# Patient Record
Sex: Male | Born: 1945 | Race: White | Hispanic: No | Smoking: Never smoker
Health system: Southern US, Community
[De-identification: ages and names within clinical notes are randomized; demographics above are authoritative.]

## PROBLEM LIST (undated history)

## (undated) DIAGNOSIS — R079 Chest pain, unspecified: Secondary | ICD-10-CM

## (undated) DIAGNOSIS — K5900 Constipation, unspecified: Secondary | ICD-10-CM

## (undated) DIAGNOSIS — K219 Gastro-esophageal reflux disease without esophagitis: Secondary | ICD-10-CM

## (undated) DIAGNOSIS — D649 Anemia, unspecified: Secondary | ICD-10-CM

## (undated) DIAGNOSIS — G809 Cerebral palsy, unspecified: Secondary | ICD-10-CM

## (undated) DIAGNOSIS — R0602 Shortness of breath: Secondary | ICD-10-CM

---

## 2004-08-03 ENCOUNTER — Ambulatory Visit: Payer: Self-pay | Admitting: Family Medicine

## 2008-03-27 ENCOUNTER — Ambulatory Visit: Payer: Self-pay | Admitting: Family Medicine

## 2009-03-21 ENCOUNTER — Ambulatory Visit: Payer: Self-pay | Admitting: Family Medicine

## 2010-03-03 ENCOUNTER — Ambulatory Visit: Payer: Self-pay | Admitting: Family Medicine

## 2011-11-12 ENCOUNTER — Ambulatory Visit: Payer: Self-pay | Admitting: Family Medicine

## 2012-09-25 ENCOUNTER — Other Ambulatory Visit: Payer: Self-pay | Admitting: Family Medicine

## 2012-09-26 LAB — URINALYSIS, COMPLETE
Bacteria: NONE SEEN
Bilirubin,UR: NEGATIVE
Blood: NEGATIVE
Glucose,UR: NEGATIVE mg/dL (ref 0–75)
Ketone: NEGATIVE
Leukocyte Esterase: NEGATIVE
Ph: 6 (ref 4.5–8.0)
Protein: NEGATIVE
RBC,UR: 1 /HPF (ref 0–5)
Squamous Epithelial: <1
WBC UR: <1 /HPF (ref 0–5)

## 2012-09-27 LAB — URINE CULTURE

## 2013-02-03 ENCOUNTER — Other Ambulatory Visit: Payer: Self-pay | Admitting: Family Medicine

## 2013-02-03 LAB — CBC WITH DIFFERENTIAL/PLATELET
Basophil #: 0.1 10*3/uL (ref 0.0–0.1)
Basophil %: 1 %
Lymphocyte #: 2 10*3/uL (ref 1.0–3.6)
Lymphocyte %: 29 %
MCHC: 34.1 g/dL (ref 32.0–36.0)
Monocyte %: 7.1 %
Neutrophil %: 54.3 %
Platelet: 269 10*3/uL (ref 150–440)
WBC: 7 10*3/uL (ref 3.8–10.6)

## 2013-02-03 LAB — COMPREHENSIVE METABOLIC PANEL
Albumin: 3.7 g/dL (ref 3.4–5.0)
Anion Gap: 9 (ref 7–16)
Calcium, Total: 9 mg/dL (ref 8.5–10.1)
Chloride: 105 mmol/L (ref 98–107)
Co2: 25 mmol/L (ref 21–32)
EGFR (African American): >60
EGFR (Non-African Amer.): >60
Glucose: 127 mg/dL — ABNORMAL HIGH (ref 65–99)
Osmolality: 279 (ref 275–301)
Potassium: 4.3 mmol/L (ref 3.5–5.1)
Total Protein: 6.7 g/dL (ref 6.4–8.2)

## 2013-04-27 ENCOUNTER — Other Ambulatory Visit: Payer: Self-pay

## 2013-04-27 LAB — URINALYSIS, COMPLETE
BACTERIA: NONE SEEN
Bilirubin,UR: NEGATIVE
Blood: NEGATIVE
Glucose,UR: NEGATIVE mg/dL (ref 0–75)
Hyaline Cast: 3
Ketone: NEGATIVE
LEUKOCYTE ESTERASE: NEGATIVE
NITRITE: NEGATIVE
Ph: 5 (ref 4.5–8.0)
Protein: NEGATIVE
SPECIFIC GRAVITY: 1.024 (ref 1.003–1.030)
Squamous Epithelial: NONE SEEN
WBC UR: <1 /HPF (ref 0–5)

## 2013-04-27 LAB — COMPREHENSIVE METABOLIC PANEL
ALBUMIN: 4.1 g/dL (ref 3.4–5.0)
ANION GAP: 3 — AB (ref 7–16)
Alkaline Phosphatase: 71 U/L
BILIRUBIN TOTAL: 0.4 mg/dL (ref 0.2–1.0)
BUN: 20 mg/dL — AB (ref 7–18)
CREATININE: 0.99 mg/dL (ref 0.60–1.30)
Calcium, Total: 9.1 mg/dL (ref 8.5–10.1)
Chloride: 105 mmol/L (ref 98–107)
Co2: 29 mmol/L (ref 21–32)
EGFR (Non-African Amer.): >60
GLUCOSE: 73 mg/dL (ref 65–99)
Osmolality: 275 (ref 275–301)
POTASSIUM: 4.6 mmol/L (ref 3.5–5.1)
SGOT(AST): 23 U/L (ref 15–37)
SGPT (ALT): 25 U/L (ref 12–78)
SODIUM: 137 mmol/L (ref 136–145)
Total Protein: 7.3 g/dL (ref 6.4–8.2)

## 2013-04-27 LAB — CBC WITH DIFFERENTIAL/PLATELET
Basophil #: 0.1 10*3/uL (ref 0.0–0.1)
Basophil %: 1 %
EOS ABS: 0.4 10*3/uL (ref 0.0–0.7)
Eosinophil %: 5.7 %
HCT: 41.4 % (ref 40.0–52.0)
HGB: 13.3 g/dL (ref 13.0–18.0)
Lymphocyte #: 2.2 10*3/uL (ref 1.0–3.6)
Lymphocyte %: 33.6 %
MCH: 30.3 pg (ref 26.0–34.0)
MCHC: 32.3 g/dL (ref 32.0–36.0)
MCV: 94 fL (ref 80–100)
MONO ABS: 0.7 x10 3/mm (ref 0.2–1.0)
Monocyte %: 9.9 %
Neutrophil #: 3.3 10*3/uL (ref 1.4–6.5)
Neutrophil %: 49.8 %
PLATELETS: 227 10*3/uL (ref 150–440)
RBC: 4.41 10*6/uL (ref 4.40–5.90)
RDW: 13.6 % (ref 11.5–14.5)
WBC: 6.6 10*3/uL (ref 3.8–10.6)

## 2013-04-27 LAB — TSH: Thyroid Stimulating Horm: 0.755 u[IU]/mL

## 2013-04-29 LAB — PSA: PSA: 0.3 ng/mL (ref 0.0–4.0)

## 2013-04-29 LAB — URINE CULTURE

## 2013-05-21 ENCOUNTER — Other Ambulatory Visit: Payer: Self-pay | Admitting: Family Medicine

## 2013-05-21 LAB — COMPREHENSIVE METABOLIC PANEL
ALK PHOS: 71 U/L
ALT: 26 U/L (ref 12–78)
Albumin: 4.1 g/dL (ref 3.4–5.0)
Anion Gap: 4 — ABNORMAL LOW (ref 7–16)
BILIRUBIN TOTAL: 0.4 mg/dL (ref 0.2–1.0)
BUN: 15 mg/dL (ref 7–18)
CALCIUM: 8.6 mg/dL (ref 8.5–10.1)
CHLORIDE: 107 mmol/L (ref 98–107)
CO2: 26 mmol/L (ref 21–32)
Creatinine: 0.99 mg/dL (ref 0.60–1.30)
EGFR (African American): >60
EGFR (Non-African Amer.): >60
Glucose: 147 mg/dL — ABNORMAL HIGH (ref 65–99)
OSMOLALITY: 277 (ref 275–301)
Potassium: 4 mmol/L (ref 3.5–5.1)
SGOT(AST): 25 U/L (ref 15–37)
SODIUM: 137 mmol/L (ref 136–145)
Total Protein: 7.2 g/dL (ref 6.4–8.2)

## 2013-05-21 LAB — CBC WITH DIFFERENTIAL/PLATELET
Basophil #: 0.1 10*3/uL (ref 0.0–0.1)
Basophil %: 0.8 %
EOS ABS: 0.3 10*3/uL (ref 0.0–0.7)
Eosinophil %: 2.7 %
HCT: 39.1 % — ABNORMAL LOW (ref 40.0–52.0)
HGB: 13.2 g/dL (ref 13.0–18.0)
LYMPHS ABS: 1.5 10*3/uL (ref 1.0–3.6)
Lymphocyte %: 13.4 %
MCH: 31.2 pg (ref 26.0–34.0)
MCHC: 33.9 g/dL (ref 32.0–36.0)
MCV: 92 fL (ref 80–100)
Monocyte #: 0.4 x10 3/mm (ref 0.2–1.0)
Monocyte %: 3.8 %
NEUTROS PCT: 79.3 %
Neutrophil #: 8.9 10*3/uL — ABNORMAL HIGH (ref 1.4–6.5)
Platelet: 217 10*3/uL (ref 150–440)
RBC: 4.25 10*6/uL — ABNORMAL LOW (ref 4.40–5.90)
RDW: 13.4 % (ref 11.5–14.5)
WBC: 11.3 10*3/uL — AB (ref 3.8–10.6)

## 2013-05-21 LAB — URINALYSIS, COMPLETE
BACTERIA: NONE SEEN
BLOOD: NEGATIVE
Bilirubin,UR: NEGATIVE
GLUCOSE, UR: NEGATIVE mg/dL (ref 0–75)
Ketone: NEGATIVE
Leukocyte Esterase: NEGATIVE
Nitrite: NEGATIVE
Ph: 5 (ref 4.5–8.0)
Protein: NEGATIVE
SPECIFIC GRAVITY: 1.015 (ref 1.003–1.030)
Squamous Epithelial: <1

## 2013-05-21 LAB — PRO B NATRIURETIC PEPTIDE: B-Type Natriuretic Peptide: 200 pg/mL — ABNORMAL HIGH (ref 0–125)

## 2013-05-22 LAB — URINE CULTURE

## 2014-09-27 ENCOUNTER — Inpatient Hospital Stay: Admit: 2014-09-27 | Payer: Self-pay

## 2014-09-27 ENCOUNTER — Other Ambulatory Visit
Admission: RE | Admit: 2014-09-27 | Discharge: 2014-09-27 | Disposition: A | Discharge status: Home / Self Care | Payer: Medicare Other | Source: Other Acute Inpatient Hospital | Attending: Family Medicine | Admitting: Family Medicine

## 2014-09-27 DIAGNOSIS — D649 Anemia, unspecified: Secondary | ICD-10-CM | POA: Diagnosis present

## 2014-09-27 DIAGNOSIS — I509 Heart failure, unspecified: Secondary | ICD-10-CM | POA: Insufficient documentation

## 2014-09-27 LAB — CBC WITH DIFFERENTIAL/PLATELET
BASOS ABS: 0.1 10*3/uL (ref 0–0.1)
Basophils Relative: 1 %
EOS ABS: 0.3 10*3/uL (ref 0–0.7)
Eosinophils Relative: 5 %
HEMATOCRIT: 22.8 % — AB (ref 40.0–52.0)
HEMOGLOBIN: 7 g/dL — AB (ref 13.0–18.0)
Lymphocytes Relative: 18 %
Lymphs Abs: 1.1 10*3/uL (ref 1.0–3.6)
MCH: 23.9 pg — ABNORMAL LOW (ref 26.0–34.0)
MCHC: 30.8 g/dL — AB (ref 32.0–36.0)
MCV: 77.3 fL — ABNORMAL LOW (ref 80.0–100.0)
Monocytes Absolute: 0.6 10*3/uL (ref 0.2–1.0)
Monocytes Relative: 10 %
NEUTROS ABS: 4 10*3/uL (ref 1.4–6.5)
Neutrophils Relative %: 66 %
Platelets: 358 10*3/uL (ref 150–440)
RBC: 2.95 MIL/uL — AB (ref 4.40–5.90)
RDW: 15.8 % — ABNORMAL HIGH (ref 11.5–14.5)
WBC: 6.1 10*3/uL (ref 3.8–10.6)

## 2014-09-27 LAB — COMPREHENSIVE METABOLIC PANEL
ALBUMIN: 3.2 g/dL — AB (ref 3.5–5.0)
ALT: 19 U/L (ref 17–63)
AST: 19 U/L (ref 15–41)
Alkaline Phosphatase: 60 U/L (ref 38–126)
Anion gap: 8 (ref 5–15)
BUN: 14 mg/dL (ref 6–20)
CO2: 24 mmol/L (ref 22–32)
Calcium: 8.6 mg/dL — ABNORMAL LOW (ref 8.9–10.3)
Chloride: 105 mmol/L (ref 101–111)
Creatinine, Ser: 0.9 mg/dL (ref 0.61–1.24)
GFR calc Af Amer: >60 mL/min (ref >60)
GFR calc non Af Amer: >60 mL/min (ref >60)
GLUCOSE: 175 mg/dL — AB (ref 65–99)
Potassium: 4.5 mmol/L (ref 3.5–5.1)
Sodium: 137 mmol/L (ref 135–145)
TOTAL PROTEIN: 6.1 g/dL — AB (ref 6.5–8.1)
Total Bilirubin: 0.4 mg/dL (ref 0.3–1.2)

## 2014-11-16 ENCOUNTER — Encounter: Payer: Self-pay | Admitting: *Deleted

## 2014-11-17 ENCOUNTER — Encounter
Admission: RE | Disposition: A | Discharge status: Home Health | Payer: Self-pay | Source: Ambulatory Visit | Attending: Gastroenterology

## 2014-11-17 ENCOUNTER — Ambulatory Visit
Admission: RE | Admit: 2014-11-17 | Discharge: 2014-11-17 | Disposition: A | Discharge status: Home Health | Payer: Medicare Other | Source: Ambulatory Visit | Attending: Gastroenterology | Admitting: Gastroenterology

## 2014-11-17 ENCOUNTER — Encounter: Payer: Self-pay | Admitting: Anesthesiology

## 2014-11-17 DIAGNOSIS — Z539 Procedure and treatment not carried out, unspecified reason: Secondary | ICD-10-CM | POA: Insufficient documentation

## 2014-11-17 HISTORY — DX: Chest pain, unspecified: R07.9

## 2014-11-17 HISTORY — DX: Cerebral palsy, unspecified: G80.9

## 2014-11-17 HISTORY — DX: Constipation, unspecified: K59.00

## 2014-11-17 HISTORY — DX: Gastro-esophageal reflux disease without esophagitis: K21.9

## 2014-11-17 HISTORY — DX: Anemia, unspecified: D64.9

## 2014-11-17 HISTORY — PX: ESOPHAGOGASTRODUODENOSCOPY (EGD) WITH PROPOFOL: SHX5813

## 2014-11-17 HISTORY — DX: Shortness of breath: R06.02

## 2014-11-17 HISTORY — PX: COLONOSCOPY WITH PROPOFOL: SHX5780

## 2014-11-17 SURGERY — COLONOSCOPY WITH PROPOFOL
Anesthesia: General

## 2014-11-18 ENCOUNTER — Encounter: Payer: Self-pay | Admitting: Gastroenterology

## 2017-02-15 ENCOUNTER — Encounter: Payer: Self-pay | Admitting: Emergency Medicine

## 2017-02-15 ENCOUNTER — Emergency Department: Payer: Medicare Other

## 2017-02-15 ENCOUNTER — Emergency Department
Admission: EM | Admit: 2017-02-15 | Discharge: 2017-02-15 | Disposition: A | Discharge status: Home / Self Care | Payer: Medicare Other | Attending: Emergency Medicine | Admitting: Emergency Medicine

## 2017-02-15 DIAGNOSIS — Y999 Unspecified external cause status: Secondary | ICD-10-CM | POA: Diagnosis not present

## 2017-02-15 DIAGNOSIS — W19XXXA Unspecified fall, initial encounter: Secondary | ICD-10-CM | POA: Diagnosis not present

## 2017-02-15 DIAGNOSIS — Z79899 Other long term (current) drug therapy: Secondary | ICD-10-CM | POA: Insufficient documentation

## 2017-02-15 DIAGNOSIS — Y92129 Unspecified place in nursing home as the place of occurrence of the external cause: Secondary | ICD-10-CM | POA: Insufficient documentation

## 2017-02-15 DIAGNOSIS — Z23 Encounter for immunization: Secondary | ICD-10-CM | POA: Insufficient documentation

## 2017-02-15 DIAGNOSIS — Y939 Activity, unspecified: Secondary | ICD-10-CM | POA: Diagnosis not present

## 2017-02-15 DIAGNOSIS — G809 Cerebral palsy, unspecified: Secondary | ICD-10-CM | POA: Diagnosis not present

## 2017-02-15 DIAGNOSIS — S0990XA Unspecified injury of head, initial encounter: Secondary | ICD-10-CM | POA: Diagnosis present

## 2017-02-15 DIAGNOSIS — S0101XA Laceration without foreign body of scalp, initial encounter: Secondary | ICD-10-CM | POA: Diagnosis not present

## 2017-02-15 LAB — COMPREHENSIVE METABOLIC PANEL
ALBUMIN: 4.2 g/dL (ref 3.5–5.0)
ALK PHOS: 63 U/L (ref 38–126)
ALT: 19 U/L (ref 17–63)
ANION GAP: 8 (ref 5–15)
AST: 25 U/L (ref 15–41)
BUN: 26 mg/dL — ABNORMAL HIGH (ref 6–20)
CALCIUM: 9 mg/dL (ref 8.9–10.3)
CO2: 23 mmol/L (ref 22–32)
Chloride: 107 mmol/L (ref 101–111)
Creatinine, Ser: 1.08 mg/dL (ref 0.61–1.24)
GFR calc Af Amer: >60 mL/min (ref >60)
GFR calc non Af Amer: >60 mL/min (ref >60)
GLUCOSE: 133 mg/dL — AB (ref 65–99)
POTASSIUM: 4.3 mmol/L (ref 3.5–5.1)
SODIUM: 138 mmol/L (ref 135–145)
Total Bilirubin: 0.6 mg/dL (ref 0.3–1.2)
Total Protein: 7.1 g/dL (ref 6.5–8.1)

## 2017-02-15 LAB — CBC WITH DIFFERENTIAL/PLATELET
Basophils Absolute: 0.1 10*3/uL (ref 0–0.1)
Basophils Relative: 1 %
Eosinophils Absolute: 0.2 10*3/uL (ref 0–0.7)
Eosinophils Relative: 4 %
HEMATOCRIT: 40.5 % (ref 40.0–52.0)
Hemoglobin: 13.6 g/dL (ref 13.0–18.0)
LYMPHS PCT: 30 %
Lymphs Abs: 1.6 10*3/uL (ref 1.0–3.6)
MCH: 30.7 pg (ref 26.0–34.0)
MCHC: 33.5 g/dL (ref 32.0–36.0)
MCV: 91.6 fL (ref 80.0–100.0)
MONO ABS: 0.4 10*3/uL (ref 0.2–1.0)
MONOS PCT: 7 %
NEUTROS ABS: 3.2 10*3/uL (ref 1.4–6.5)
Neutrophils Relative %: 58 %
Platelets: 254 10*3/uL (ref 150–440)
RBC: 4.42 MIL/uL (ref 4.40–5.90)
RDW: 13 % (ref 11.5–14.5)
WBC: 5.4 10*3/uL (ref 3.8–10.6)

## 2017-02-15 MED ORDER — TETANUS-DIPHTH-ACELL PERTUSSIS 5-2.5-18.5 LF-MCG/0.5 IM SUSP
0.5000 mL | Freq: Once | INTRAMUSCULAR | Status: AC
  Administered 2017-02-15: 0.5 mL via INTRAMUSCULAR
  Filled 2017-02-15: qty 0.5

## 2017-02-15 MED ORDER — LIDOCAINE-EPINEPHRINE 1 %-1:100000 IJ SOLN
10.0000 mL | Freq: Once | INTRAMUSCULAR | Status: AC
  Administered 2017-02-15: 10 mL via INTRADERMAL
  Filled 2017-02-15 (×2): qty 10

## 2017-02-15 NOTE — Discharge Instructions (Signed)
Today I placed 4 staples in your scalp.  It is important that they come out in 10-14 days.  Please do not get your head wet for at least 24 hours and then afterwards make sure you keep your wound clean and dry.  Return to the emergency department for any concerns.  It was a pleasure to take care of you today, and thank you for coming to our emergency department.  If you have any questions or concerns before leaving please ask the nurse to grab me and I'm more than happy to go through your aftercare instructions again.  If you were prescribed any opioid pain medication today such as Norco, Vicodin, Percocet, morphine, hydrocodone, or oxycodone please make sure you do not drive when you are taking this medication as it can alter your ability to drive safely.  If you have any concerns once you are home that you are not improving or are in fact getting worse before you can make it to your follow-up appointment, please do not hesitate to call [phone] and come back for further evaluation.  Merrily BrittleNeil Makari Sanko, MD  Results for orders placed or performed during the hospital encounter of 02/15/17  Comprehensive metabolic panel  Result Value Ref Range   Sodium 138 135 - 145 mmol/L   Potassium 4.3 3.5 - 5.1 mmol/L   Chloride 107 101 - 111 mmol/L   CO2 23 22 - 32 mmol/L   Glucose, Bld 133 (H) 65 - 99 mg/dL   BUN 26 (H) 6 - 20 mg/dL   Creatinine, Ser 4.091.08 0.61 - 1.24 mg/dL   Calcium 9.0 8.9 - 81.110.3 mg/dL   Total Protein 7.1 6.5 - 8.1 g/dL   Albumin 4.2 3.5 - 5.0 g/dL   AST 25 15 - 41 U/L   ALT 19 17 - 63 U/L   Alkaline Phosphatase 63 38 - 126 U/L   Total Bilirubin 0.6 0.3 - 1.2 mg/dL   GFR calc non Af Amer >60 >60 mL/min   GFR calc Af Amer >60 >60 mL/min   Anion gap 8 5 - 15  CBC with Differential  Result Value Ref Range   WBC 5.4 3.8 - 10.6 K/uL   RBC 4.42 4.40 - 5.90 MIL/uL   Hemoglobin 13.6 13.0 - 18.0 g/dL   HCT 91.440.5 78.240.0 - 95.652.0 %   MCV 91.6 80.0 - 100.0 fL   MCH 30.7 26.0 - 34.0 pg   MCHC 33.5  32.0 - 36.0 g/dL   RDW 21.313.0 08.611.5 - 57.814.5 %   Platelets 254 150 - 440 K/uL   Neutrophils Relative % 58 %   Neutro Abs 3.2 1.4 - 6.5 K/uL   Lymphocytes Relative 30 %   Lymphs Abs 1.6 1.0 - 3.6 K/uL   Monocytes Relative 7 %   Monocytes Absolute 0.4 0.2 - 1.0 K/uL   Eosinophils Relative 4 %   Eosinophils Absolute 0.2 0 - 0.7 K/uL   Basophils Relative 1 %   Basophils Absolute 0.1 0 - 0.1 K/uL   Ct Head Wo Contrast  Result Date: 02/15/2017 CLINICAL DATA:  Unwitnessed fall, laceration to back of head, history of cerebral palsy EXAM: CT HEAD WITHOUT CONTRAST TECHNIQUE: Contiguous axial images were obtained from the base of the skull through the vertex without intravenous contrast. Sagittal and coronal MPR images reconstructed from axial data set. COMPARISON:  None FINDINGS: Brain: Generalized atrophy. Normal ventricular morphology. No midline shift or mass effect. Otherwise normal appearance of brain parenchyma. No intracranial hemorrhage, mass lesion,  or evidence of acute infarction. No extra-axial fluid collections. Vascular: Minimal atherosclerotic calcifications within the internal carotid arteries at the skullbase. No hyperdense vessels. Skull: Intact Sinuses/Orbits: Clear Other: Small posterior RIGHT parietal scalp laceration and mild soft tissue swelling IMPRESSION: Generalized atrophy. No acute intracranial abnormalities. Electronically Signed   By: Ulyses SouthwardMark  Boles M.D.   On: 02/15/2017 11:54   Dg Chest Port 1 View  Result Date: 02/15/2017 CLINICAL DATA:  Patient presents to ED via [facility] from [facility] post unwitnessed fall. Patient has laceration noted to back of head. Patient c/o left side pain. Hx - Cerebral palsy, non-smoker. EXAM: PORTABLE CHEST - 1 VIEW COMPARISON:  none FINDINGS: Low lung volumes with resultant crowding of bronchovascular spur structures. No confluent airspace disease or overt edema. Heart size upper limits normal for technique. Retrocardiac double density suggesting  hiatal hernia. No effusion.  No pneumothorax. Chronic appearing fracture deformity of right fifth rib laterally. Visualized bones otherwise unremarkable. IMPRESSION: Low volumes.  No acute cardiopulmonary disease. Electronically Signed   By: Corlis Leak  Hassell M.D.   On: 02/15/2017 11:04

## 2017-02-15 NOTE — ED Notes (Signed)
Pharmacy called and notified of need of lido with epi.

## 2017-02-15 NOTE — ED Provider Notes (Signed)
[facility] Emergency Department Provider Note  ____________________________________________   First MD Initiated Contact with Patient 02/15/17 1043     (approximate)  I have reviewed the triage vital signs and the nursing notes.   HISTORY  Chief Complaint Fall  Level 5 exemption history limited by the patient's developmental delay  HPI Jamie Carrillo is a [age] y.o. male is brought to the emergency department by EMS after he had a fall at his nursing home.  According to EMS the fall was unwitnessed.  The patient has a long-standing history of cerebral palsy and normally transfers on his own and staff says he frequently falls when transferring.  His last tetanus is unknown.  He does have a laceration to his right occiput.  He takes no blood thinning medication.  He currently has no complaints.  Past Medical History:  Diagnosis Date  . Anemia   . Cerebral palsy (HCC)   . Chest pain   . Constipation   . GERD (gastroesophageal reflux disease)   . SOB (shortness of breath)     There are no active problems to display for this patient.   Past Surgical History:  Procedure Laterality Date  . COLONOSCOPY WITH PROPOFOL N/A 11/17/2014   Procedure: COLONOSCOPY WITH PROPOFOL;  Surgeon: Elnita MaxwellMatthew Gordon Rein, MD;  [facility];  Service: Endoscopy;  Laterality: N/A;  . ESOPHAGOGASTRODUODENOSCOPY (EGD) WITH PROPOFOL N/A 11/17/2014   Procedure: ESOPHAGOGASTRODUODENOSCOPY (EGD) WITH PROPOFOL;  Surgeon: Elnita MaxwellMatthew Gordon Rein, MD;  [facility];  Service: Endoscopy;  Laterality: N/A;    Prior to Admission medications   Medication Sig Start Date End Date Taking? Authorizing Provider  acetaminophen (TYLENOL) 500 MG tablet Take 500 mg by mouth every 6 (six) hours as needed.    [provider]  amoxicillin (AMOXIL) 500 MG tablet Take 500 mg by mouth 2 (two) times daily.    [provider]  Ascorbic Acid (VITAMIN C) 1000 MG tablet Take 1,000  mg by mouth daily.    [provider]  cetirizine (ZYRTEC) 10 MG tablet Take 10 mg by mouth daily.    [provider]  citalopram (CELEXA) 20 MG tablet Take 20 mg by mouth daily.    [provider]  clarithromycin (BIAXIN) 500 MG tablet Take 500 mg by mouth 2 (two) times daily.    [provider]  diclofenac sodium (VOLTAREN) 1 % GEL Apply 2 g topically 2 (two) times daily.    [provider]  doxazosin (CARDURA) 4 MG tablet Take 4 mg by mouth daily.    [provider]  Ferrous Sulfate Dried 200 (65 FE) MG TABS Take 325 mg by mouth every morning.    [provider]  furosemide (LASIX) 20 MG tablet Take 20 mg by mouth daily.    [provider]  HYDROcodone-acetaminophen (NORCO) 7.5-325 MG per tablet Take 1 tablet by mouth every 6 (six) hours as needed for moderate pain.    [provider]  ipratropium-albuterol (DUONEB) 0.5-2.5 (3) MG/3ML SOLN Take 3 mLs by nebulization 4 (four) times daily.    [provider]  isotretinoin (ACCUTANE) 10 MG capsule Take 10 mg by mouth 2 (two) times daily.    [provider]  lisinopril (PRINIVIL,ZESTRIL) 5 MG tablet Take 5 mg by mouth daily.    [provider]  loratadine (CLARITIN) 10 MG tablet Take 10 mg by mouth daily.    [provider]  magnesium hydroxide (MILK OF MAGNESIA) 400 MG/5ML suspension  Take by mouth daily as needed for mild constipation.    [provider]  magnesium oxide (MAG-OX) 400 MG tablet Take 400 mg by mouth daily.    [provider]  Melatonin 3 MG TABS Take 3 mg by mouth 1 day or 1 dose.    [provider]  Multiple Vitamins-Minerals (MULTIVITAMIN WITH MINERALS) tablet Take 1 tablet by mouth daily.    [provider]  omeprazole (PRILOSEC) 20 MG capsule Take 20 mg by mouth daily.    [provider]  pantoprazole (PROTONIX) 40 MG tablet Take 40 mg by mouth daily.    [provider]  Propylene Glycol 0.6 % SOLN Apply 1 drop to eye 4 (four) times daily as needed.    [provider]  senna-docusate (SENOKOT-S) 8.6-50 MG per tablet Take 1 tablet by mouth at bedtime as needed for mild constipation.    [provider]  simvastatin (ZOCOR) 40 MG tablet Take 40 mg by mouth daily.    [provider]  tiZANidine (ZANAFLEX) 4 MG capsule Take 4 mg by mouth every morning.    [provider]  traMADol (ULTRAM) 50 MG tablet Take by mouth every 6 (six) hours as needed.    [provider]  vitamin B-12 (CYANOCOBALAMIN) 1000 MCG tablet Take 1,000 mcg by mouth daily.    [provider]    Allergies Patient has no known allergies.  No family history on file.  Social History Social History   Tobacco Use  . Smoking status: Never Smoker  . Smokeless tobacco: Never Used  Substance Use Topics  . Alcohol use: No    Frequency: Never  . Drug use: No    Review of Systems Level 5 exemption history limited by the patient's developmental delay  ____________________________________________   PHYSICAL EXAM:  VITAL SIGNS: ED Triage Vitals [02/15/17 1040]  Enc Vitals Group     BP      Pulse      Resp      Temp      Temp src      SpO2      Weight 150 lb (68 kg)     Height 5\' 6"  (1.676 m)     Head Circumference      [facility] Flow      Pain Score      Pain Loc      Pain Edu?      Excl. in GC?     Constitutional: Pleasant cooperative in no acute distress Eyes: PERRL EOMI. Head: 6 cm laceration to right occiput with no active bleeding. Nose: No congestion/rhinnorhea. Mouth/Throat: No trismus Neck: No stridor.  No midline tenderness or step-offs Cardiovascular: Normal rate, regular rhythm. Grossly normal heart sounds.  Good peripheral circulation. Respiratory: Normal respiratory effort.  No retractions. Lungs CTAB and moving good air Gastrointestinal: Soft nontender Musculoskeletal: No lower extremity edema     Neurologic:   No gross focal neurologic deficits are appreciated. Skin:  Skin is warm, dry and intact. No rash noted. Psychiatric: Mood and affect are normal. Speech and behavior are normal.    ____________________________________________   DIFFERENTIAL includes but not limited to  Intracerebral hemorrhage, mechanical fall, syncope, vasovagal syncope, cardiogenic syncope, laceration ____________________________________________   LABS (all labs ordered are listed, but only abnormal results are displayed)  Labs Reviewed  COMPREHENSIVE METABOLIC PANEL - Abnormal; Notable for the following components:      Result Value   Glucose, Bld 133 (*)  BUN 26 (*)    All other components within normal limits  CBC WITH DIFFERENTIAL/PLATELET    Blood work reviewed by me with no acute disease __________________________________________  EKG  ED ECG REPORT I, Merrily Brittle, the attending physician, personally viewed and interpreted this ECG.  Date: 02/15/2017 EKG Time:  Rate: 80 Rhythm: normal sinus rhythm QRS Axis: normal Intervals: normal ST/T Wave abnormalities: normal Narrative Interpretation: no evidence of acute ischemia  ____________________________________________  RADIOLOGY  Head CT reviewed by me with no acute disease chest x-ray reviewed by me with no acute disease ____________________________________________   PROCEDURES  Procedure(s) performed: yes  .Marland KitchenLaceration Repair Date/Time: 02/15/2017 12:08 PM Performed by: Merrily Brittle, MD Authorized by: Merrily Brittle, MD   Consent:    Consent obtained:  Verbal   Consent given by:  Patient   Risks discussed:  Infection, pain, poor cosmetic result, need for additional repair and poor wound healing   Alternatives discussed:  No treatment and delayed treatment Anesthesia (see MAR for exact dosages):    Anesthesia method:  Local infiltration   Local anesthetic:  Lidocaine 1% WITH epi Laceration details:     Location:  Scalp   Length (cm):  6 Repair type:    Repair type:  Simple Pre-procedure details:    Preparation:  Patient was prepped and draped in usual sterile fashion Exploration:    Contaminated: no   Treatment:    Area cleansed with:  Shur-Clens   Amount of cleaning:  Standard   Irrigation solution:  Sterile saline   Irrigation volume:  200cc   Irrigation method:  Pressure wash   Visualized foreign bodies/material removed: no   Skin repair:    Repair method:  Staples   Number of staples:  4 Approximation:    Approximation:  Close   Vermilion border: well-aligned       Critical Care performed: no  Observation: no ____________________________________________   INITIAL IMPRESSION / ASSESSMENT AND PLAN / ED COURSE  Pertinent labs & imaging results that were available during my care of the patient were reviewed by me and considered in my medical decision making (see chart for details).  Stable neurologically intact and very well-appearing.  He has an obvious laceration to his right occiput.  Head CT is fortunately negative for acute pathology.  I washed out his wound and closed it with good cosmesis with staples.  The patient is back to his baseline.  Blood work unremarkable.  EKG with no concerning signs for cardiogenic syncope.  He is discharged back to his nursing home.  He verbalizes understanding and agree with the plan.      ____________________________________________   FINAL CLINICAL IMPRESSION(S) / ED DIAGNOSES  Final diagnoses:  Fall, initial encounter  Laceration of scalp, initial encounter      NEW MEDICATIONS STARTED DURING THIS VISIT:  This SmartLink is deprecated. Use AVSMEDLIST instead to display the medication list for a patient.   Note:  This document was prepared using Dragon voice recognition software and may include unintentional dictation errors.     Merrily Brittle, MD 02/16/17 [phone]

## 2017-02-15 NOTE — ED Triage Notes (Signed)
Patient presents to ED via [facility] from [facility] post unwitnessed fall. Patient has laceration noted to back of head. Alert on arrival, follows commands. Patient c/o left side pain.

## 2017-02-15 NOTE — ED Notes (Signed)
[facility] called and notified of discharge instructions. Nurse, Shelly verbalizes understanding. She also reports that [facility] can not provide transportation back to facility. ED secretary notified that patient will need non emergency transportation back to [facility].

## 2017-02-15 NOTE — ED Notes (Signed)
EMS has arrived to transport patient back to facility.  

## 2017-02-15 NOTE — ED Notes (Signed)
Lac cart and lido with epi at bedside. MD notified and aware.

## 2018-01-28 ENCOUNTER — Other Ambulatory Visit: Payer: Self-pay | Admitting: Family Medicine

## 2018-01-28 DIAGNOSIS — R1312 Dysphagia, oropharyngeal phase: Secondary | ICD-10-CM

## 2018-02-16 ENCOUNTER — Ambulatory Visit
Admission: RE | Admit: 2018-02-16 | Discharge: 2018-02-16 | Disposition: A | Discharge status: Home / Self Care | Payer: Medicare Other | Source: Ambulatory Visit | Attending: Family Medicine | Admitting: Family Medicine

## 2018-02-16 DIAGNOSIS — R1312 Dysphagia, oropharyngeal phase: Secondary | ICD-10-CM | POA: Diagnosis present

## 2018-02-16 NOTE — Therapy (Signed)
[facility] DIAGNOSTIC RADIOLOGY [address] [place], [place], [address] Phone: [phone]   Fax:     Modified Barium Swallow  Patient Details  Name: Jamie PapJohn W Hine MRN: [id number] Date of Birth: 01/23/1946 No data recorded  Encounter Date: 02/16/2018  End of Session - 02/16/18 1315    Visit Number  1    Number of Visits  1    Date for SLP Re-Evaluation  02/16/18    SLP Start Time  1215    SLP Stop Time   1300    SLP Time Calculation (min)  45 min    Activity Tolerance  Patient tolerated treatment well       Past Medical History:  Diagnosis Date  . Anemia   . Cerebral palsy (HCC)   . Chest pain   . Constipation   . GERD (gastroesophageal reflux disease)   . SOB (shortness of breath)     Past Surgical History:  Procedure Laterality Date  . COLONOSCOPY WITH PROPOFOL N/A 11/17/2014   Procedure: COLONOSCOPY WITH PROPOFOL;  Surgeon: Elnita MaxwellMatthew Gordon Rein, MD;  [facility];  Service: Endoscopy;  Laterality: N/A;  . ESOPHAGOGASTRODUODENOSCOPY (EGD) WITH PROPOFOL N/A 11/17/2014   Procedure: ESOPHAGOGASTRODUODENOSCOPY (EGD) WITH PROPOFOL;  Surgeon: Elnita MaxwellMatthew Gordon Rein, MD;  [facility];  Service: Endoscopy;  Laterality: N/A;    There were no vitals filed for this visit.   Subjective: Patient behavior: (alertness, ability to follow instructions, etc.):  The patient is alert and able to follow directions for this study.  The patient has CP and speech is difficult to understand.  Chief complaint:  Patient states that he has not trouble swallowing.   Objective:  Radiological Procedure: A videoflouroscopic evaluation of oral-preparatory, reflex initiation, and pharyngeal phases of the swallow was performed; as well as a screening of the upper esophageal phase.  I. POSTURE: Upright in MBS chair; patient has athetoid movements, including neck and tongue.  II. VIEW: Lateral  III. COMPENSATORY STRATEGIES: Dry swallow- aids  clearance of vallecular residue.  Liquid chaser- aids clearance of vallecular residue  IV. BOLUSES ADMINISTERED:   Thin Liquid: 3 rapid consecutive via straw   Nectar-thick Liquid: 2 rapid consecutive via straw   Honey-thick Liquid: DNT   Puree: 2 teaspoon presentations   Mechanical Soft:  graham cracker in applesauce   V. RESULTS OF EVALUATION: A. ORAL PREPARATORY PHASE: (The lips, tongue, and velum are observed for strength and coordination)       **Overall Severity Rating: Moderate; athetoid movements of neck and tongue, disorganized and prolonged posterior transer, pre-swallow spill into pharynx.  Functional mastication of  graham cracker in applesauce   B. SWALLOW INITIATION/REFLEX: (The reflex is normal if "triggered" by the time the bolus reached the base of the tongue)  **Overall Severity Rating: Moderate-severe; triggers at the pyriform sinuses with liquids  C. PHARYNGEAL PHASE: (Pharyngeal function is normal if the bolus shows rapid, smooth, and continuous transit through the pharynx and there is no pharyngeal residue after the swallow)  **Overall Severity Rating: Mild-moderate; dreased pharyngeal pressure generation with moderate-severe vallecular residue with solids and trace vallecular residue with liquids.  D. LARYNGEAL PENETRATION: (Material entering into the laryngeal inlet/vestibule but not aspirated)  TRANSIENT X1 with puree and X1 with thin.  E. ASPIRATION: NONE  F. ESOPHAGEAL PHASE: (Screening of the upper esophagus): no overt abnormality within the viewable cervical esophagus  ASSESSMENT: This 72 year old man; with cerebral palsy, GERD, and no known pulmonary problems; is presenting with  moderate oropharyngeal dysphagia characterized by disorganized/slowed oral management, edentulous, athetoid movements of neck and tongue, delayed pharyngeal swallow initiation, decreased pharyngeal pressure generation (decreased tongue base retraction), variable vallecular residue  post swallow, and occasional transient laryngeal penetration.  There is no observed tracheal aspiration.  Despite the athetoid movements, the patient is protecting his airway and does not appear to be at significant risk for prandial aspiration at this time.  The patient was able to adequately masticate and swallow  graham cracker in applesauce and drink liquids via straw.  The patient has less pharyngeal residue with liquids than solids and may benefit from alternating solid and liquid boluses.    PLAN/RECOMMENDATIONS:   A. Diet: Per treating SLP and patient   B. Swallowing Precautions: Standard swallowing precautions   C. Recommended consultation to: N/A   D. Therapy recommendations: determine appropriate diet consistency and swallowing guidelines given clinical presentation, the results of this study, and patient wishes.   E. Results and recommendations were discussed with the patient immediately following the study and the final report routed to the referring MD and treating SLP.   Patient will benefit from skilled therapeutic intervention in order to improve the following deficits and impairments:   Oropharyngeal dysphagia - Plan: DG OP Swallowing Func-Medicare/Speech Path, DG OP Swallowing Func-Medicare/Speech Path        Problem List There are no active problems to display for this patient.  Dollene Primrose, MS/CCC- SLP Tedd Sias, Susie 02/16/2018, 1:17 PM  [facility] DIAGNOSTIC RADIOLOGY [address] [place], [place], [address] Phone: [phone]   Fax:     Name: OWAIN ECKERMAN MRN: [id number] Date of Birth: 1945-11-09

## 2019-01-10 DEATH — deceased

## 2019-06-10 IMAGING — CT CT HEAD W/O CM
4 of 6 series · 15 of 47 positions shown, 16 images · non-contrast
Comparison: None

CLINICAL DATA: Unwitnessed fall, laceration to back of head,
history of cerebral palsy

EXAM:
CT HEAD WITHOUT CONTRAST
TECHNIQUE: Contiguous axial images were obtained from the base of the skull
through the vertex without intravenous contrast. Sagittal and
coronal MPR images reconstructed from axial data set.

[Series 4: head wo · axial · 0.36mm/px · z∈[-61,+26]mm · 3 of 38 slices shown, 4 images]
[im 10/38  brain]
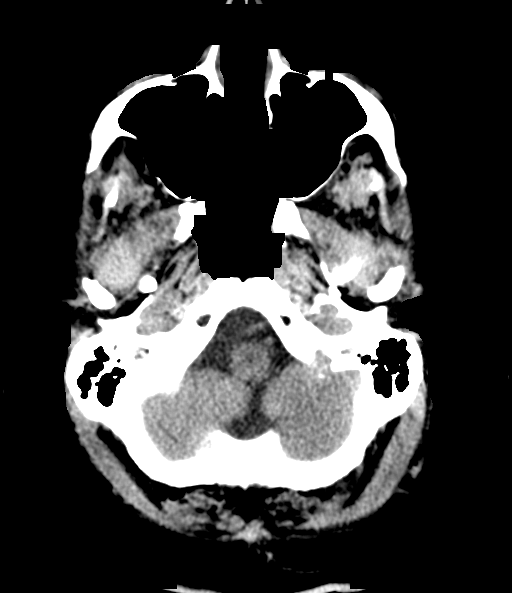
[im 10/38  bone]
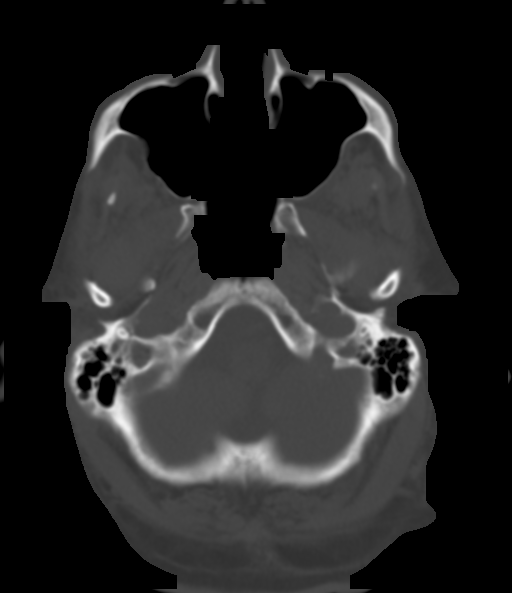
[im 19/38  brain]
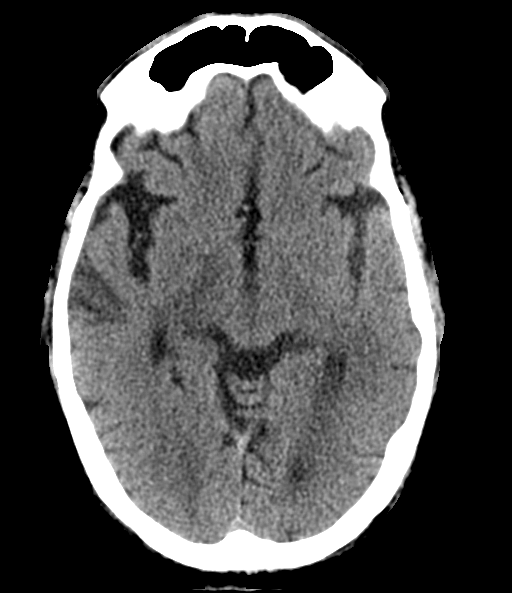
[im 28/38  brain]
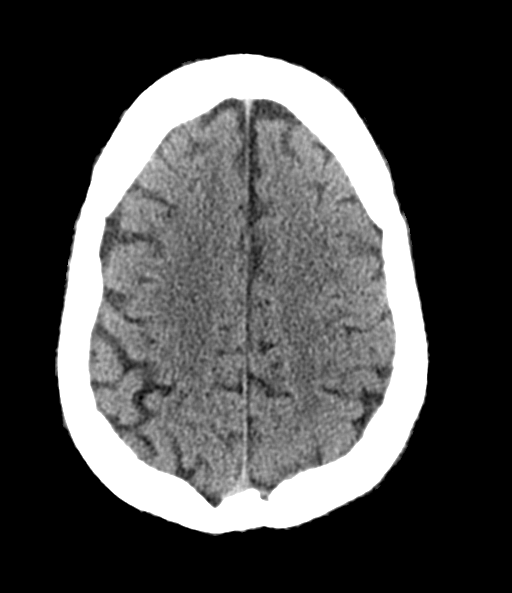

[Series 5: head bone · axial · 0.33mm/px · z∈[-93,+5]mm · 6 of 95 slices shown]
[im 9/95  bone]
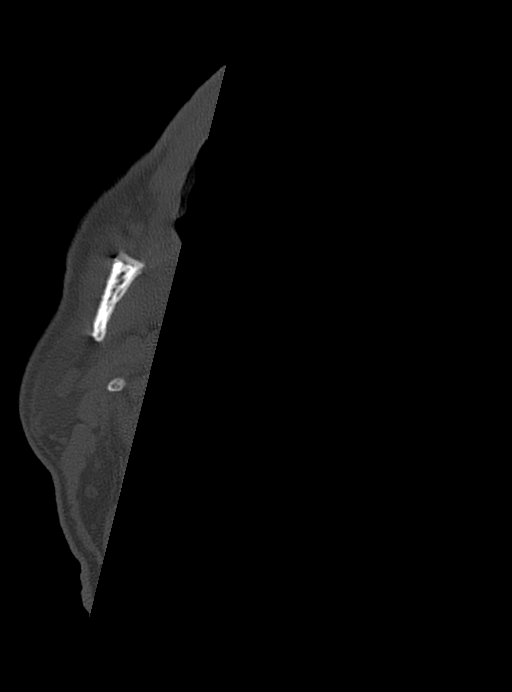
[im 18/95  bone]
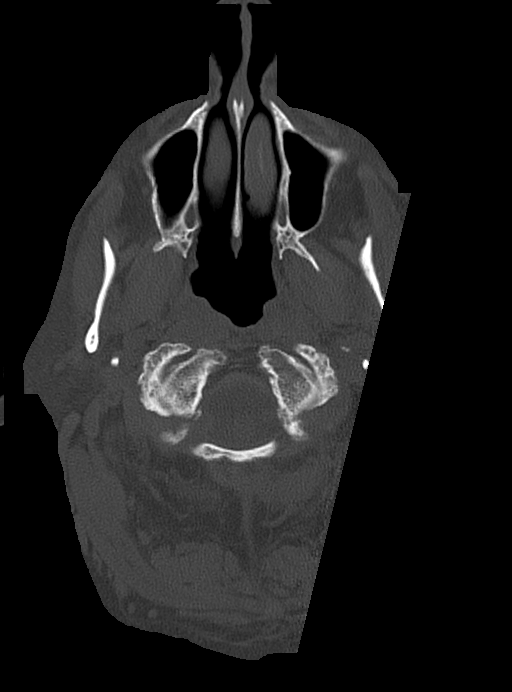
[im 35/95  bone]
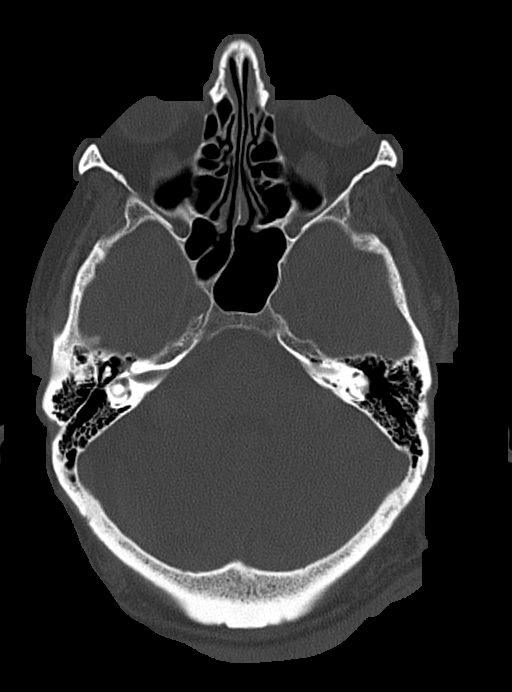
[im 43/95  bone]
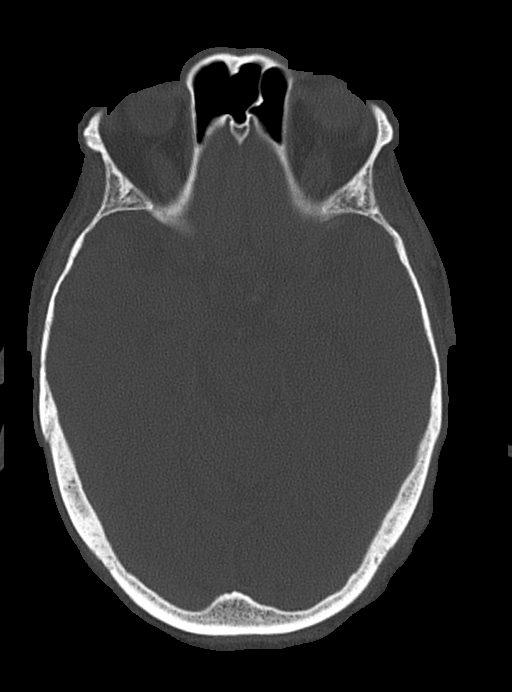
[im 52/95  bone]
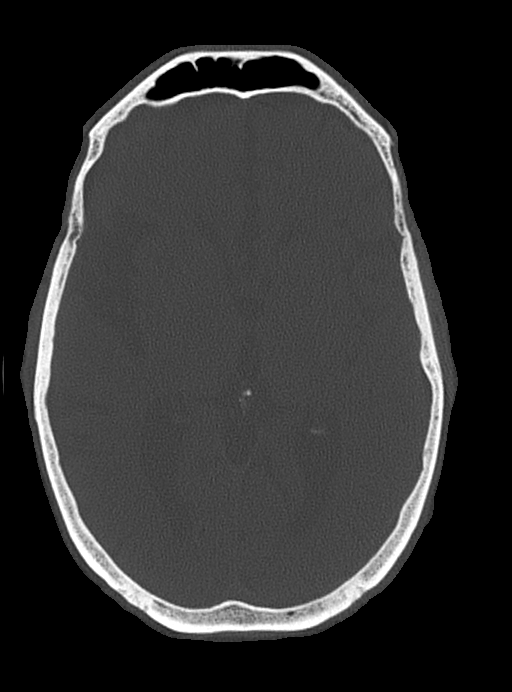
[im 60/95  bone]
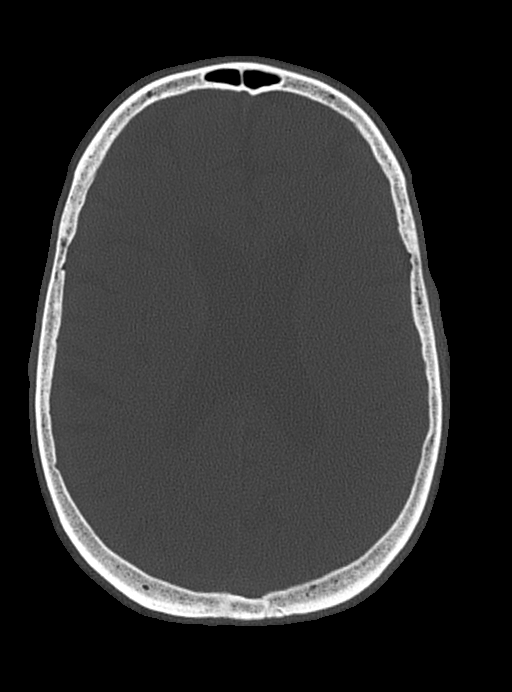

[Series 6: coronal soft tissue · coronal · 0.36mm/px · 3 of 76 slices shown]
[im 26/76  brain]
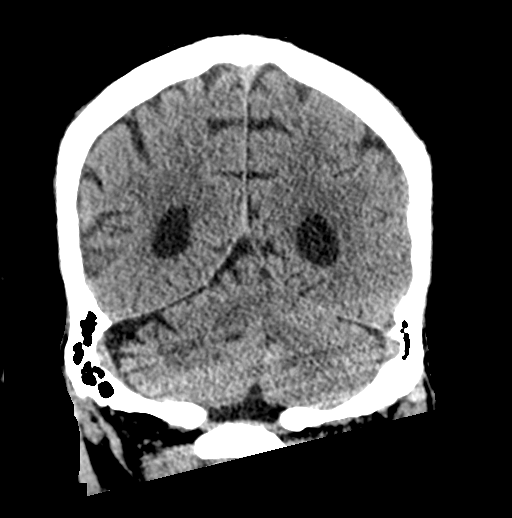
[im 34/76  brain]
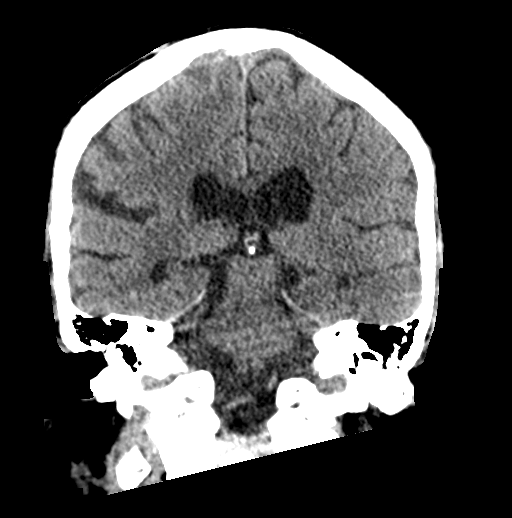
[im 42/76  brain]
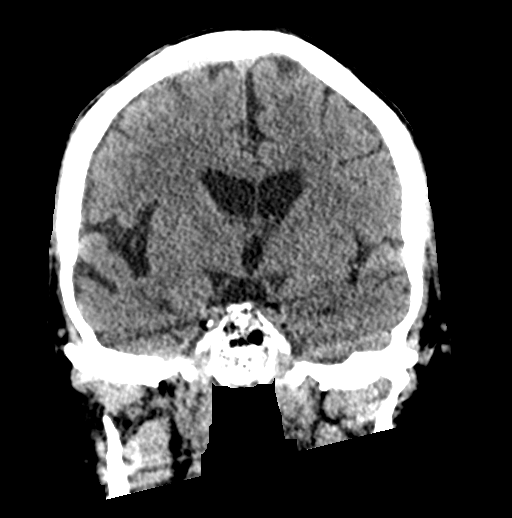

[Series 7: sagittal soft tissue · sagittal · 0.36mm/px · 3 of 55 slices shown]
[im 23/55  brain]
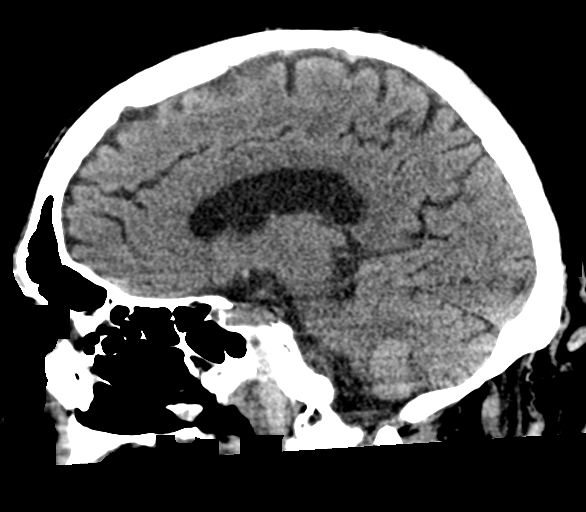
[im 28/55  brain]
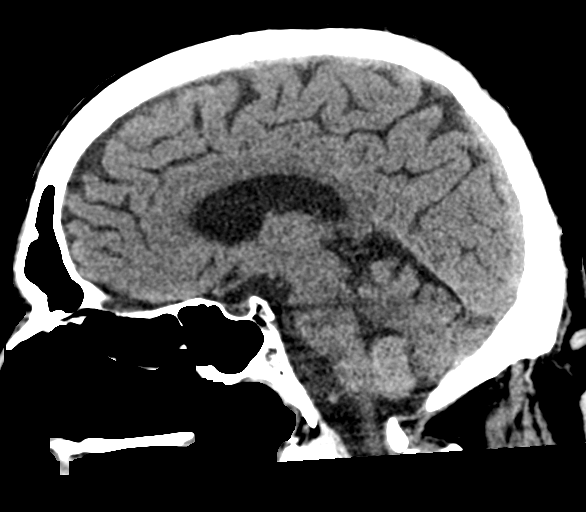
[im 32/55  brain]
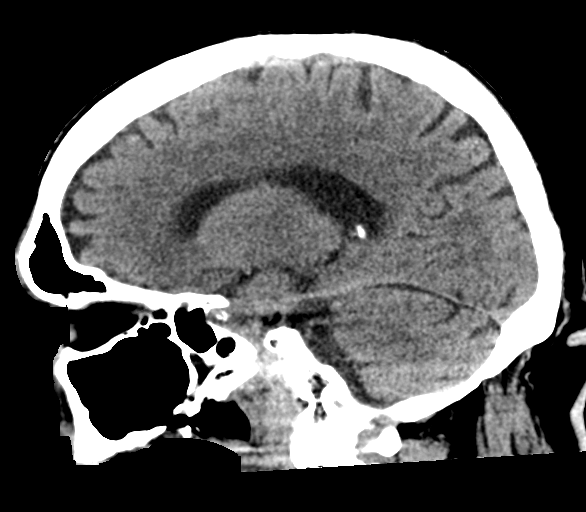

[15 of 47 positions shown; findings below may reference images not displayed]

FINDINGS: Brain: Generalized atrophy. Normal ventricular morphology. No
midline shift or mass effect. Otherwise normal appearance of brain
parenchyma. No intracranial hemorrhage, mass lesion, or evidence of
acute infarction. No extra-axial fluid collections.

Vascular: Minimal atherosclerotic calcifications within the internal
carotid arteries at the skullbase. No hyperdense vessels.

Skull: Intact

Sinuses/Orbits: Clear

Other: Small posterior RIGHT parietal scalp laceration and mild soft
tissue swelling
IMPRESSION: Generalized atrophy.

No acute intracranial abnormalities.
